# Patient Record
Sex: Male | Born: 1990 | Race: Black or African American | Hispanic: No | Marital: Single | Smoking: Never smoker
Health system: Southern US, Community
[De-identification: ages and names within clinical notes are randomized; demographics above are authoritative.]

---
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
## 2022-08-12 ENCOUNTER — Ambulatory Visit
Admission: EM | Admit: 2022-08-12 | Discharge: 2022-08-12 | Disposition: A | Discharge status: Home / Self Care | Payer: 59 | Attending: Emergency Medicine | Admitting: Emergency Medicine

## 2022-08-12 DIAGNOSIS — M25562 Pain in left knee: Secondary | ICD-10-CM

## 2022-08-12 MED ORDER — NAPROXEN 375 MG PO TABS: 375.0000 mg | ORAL_TABLET | Freq: Two times a day (BID) | ORAL | 0 refills | Status: AC

## 2022-08-12 MED ORDER — KETOROLAC TROMETHAMINE 30 MG/ML IJ SOLN
30.0000 mg | Freq: Once | INTRAMUSCULAR | Status: AC
  Administered 2022-08-12: 30 mg via INTRAMUSCULAR

## 2022-08-12 NOTE — ED Provider Notes (Signed)
[facility]    CSN: [id number] Arrival date & time: 08/12/22  1121      History   Chief Complaint Chief Complaint  Patient presents with   Knee Injury    HPI Bradley Atkins is a 32 y.o. male. Pt presents with an iinjury to the Lt knee. Pt states he was playing basketball 08/10/22 and states he felt a pull in the knee after he jumped up for a layup. Reports knee was also sore earlier in the week but he stretched it and felt it was better so went to play basketball. It didn't hurt too much at the time and later that day 08/10/22, but he woke up 08/11/22 with pain that has continued to worsen. He has applied ice. Pt states he has taken anti-inflammatory medicine at home evening of 08/10/22 but none since. Denies any other injury  HPI  History reviewed. No pertinent past medical history.  There are no problems to display for this patient.   History reviewed. No pertinent surgical history.     Home Medications    Prior to Admission medications   Medication Sig Start Date End Date Taking? Authorizing Provider  naproxen (NAPROSYN) 375 MG tablet Take 1 tablet (375 mg total) by mouth 2 (two) times daily. 08/12/22  Yes Cathlyn Parsons, NP    Family History History reviewed. No pertinent family history.  Social History Social History   Tobacco Use   Smoking status: Never   Smokeless tobacco: Never     Allergies   Patient has no known allergies.   Review of Systems Review of Systems   Physical Exam Triage Vital Signs ED Triage Vitals  Enc Vitals Group     BP 08/12/22 1144 (!) 145/81     Pulse Rate 08/12/22 1142 79     Resp 08/12/22 1142 15     Temp 08/12/22 1142 98.4 F (36.9 C)     Temp Source 08/12/22 1142 Oral     SpO2 08/12/22 1142 97 %     Weight --      Height --      Head Circumference --      Peak Flow --      Pain Score 08/12/22 1142 10     Pain Loc --      Pain Edu? --      Excl. in GC? --    No data found.  Updated Vital  Signs BP (!) 145/81   Pulse 79   Temp 98.4 F (36.9 C) (Oral)   Resp 15   SpO2 97%   Visual Acuity Right Eye Distance:   Left Eye Distance:   Bilateral Distance:    Right Eye Near:   Left Eye Near:    Bilateral Near:     Physical Exam Constitutional:      Appearance: Normal appearance.  HENT:     Head: Normocephalic and atraumatic.  Musculoskeletal:     Left knee: No swelling, deformity, erythema or ecchymosis. Decreased range of motion. Tenderness present over the patellar tendon.     Comments: Decreased ROM due to pain when flexing or extending knee in lower patella area  Neurological:     Mental Status: He is alert.      UC Treatments / Results  Labs (all labs ordered are listed, but only abnormal results are displayed) Labs Reviewed - No data to display  EKG   Radiology No results found.  Procedures Procedures (including critical care time)  Medications  Ordered in UC Medications  ketorolac (TORADOL) 30 MG/ML injection 30 mg (30 mg Intramuscular Given 08/12/22 1214)    Initial Impression / Assessment and Plan / UC Course  I have reviewed the triage vital signs and the nursing notes.  Pertinent labs & imaging results that were available during my care of the patient were reviewed by me and considered in my medical decision making (see chart for details).    Given IM toradol for pain. Rx naproxen for home. To f/u with sports medicine.   Final Clinical Impressions(s) / UC Diagnoses   Final diagnoses:  Acute pain of left knee     Discharge Instructions      Call today to get an appointment set up with the sports medicine clinic (see contact info below). They can help figure out if you need any kind imaging of your knee or any kind of therapy.   You were given ketorolac today for pain in urgent care - don't start the prescription pain medicine until tonight at home. Don't take any ibuprofen or Aleve with this prescription pain medicine - they are all  in the same class. It is ok to take tylenol per package directions in addition to the prescription naproxen if needed.    ED Prescriptions     Medication Sig Dispense Auth. Provider   naproxen (NAPROSYN) 375 MG tablet Take 1 tablet (375 mg total) by mouth 2 (two) times daily. 20 tablet Cathlyn Parsons, NP      PDMP not reviewed this encounter.   Cathlyn Parsons, NP 08/12/22 1622

## 2022-08-12 NOTE — ED Triage Notes (Signed)
Pt presents with an iinjury tot he Lt knee. Pt states he was playing basketball Saturday and states he felt a pull in the knee.   Pt states he has taken anti-inflammatory medicine at home.

## 2022-08-12 NOTE — Discharge Instructions (Addendum)
Call today to get an appointment set up with the sports medicine clinic (see contact info below). They can help figure out if you need any kind imaging of your knee or any kind of therapy.   You were given ketorolac today for pain in urgent care - don't start the prescription pain medicine until tonight at home. Don't take any ibuprofen or Aleve with this prescription pain medicine - they are all in the same class. It is ok to take tylenol per package directions in addition to the prescription naproxen if needed.
